# Patient Record
Sex: Female | Born: 1980 | Race: White | Hispanic: No | Marital: Single | Smoking: Never smoker
Health system: Southern US, Community
[De-identification: ages and names within clinical notes are randomized; demographics above are authoritative.]

## PROBLEM LIST (undated history)

## (undated) DIAGNOSIS — F419 Anxiety disorder, unspecified: Secondary | ICD-10-CM

## (undated) DIAGNOSIS — N289 Disorder of kidney and ureter, unspecified: Secondary | ICD-10-CM

---
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
## 2001-10-30 ENCOUNTER — Emergency Department (HOSPITAL_COMMUNITY): Admission: EM | Admit: 2001-10-30 | Discharge: 2001-10-30 | Payer: Self-pay | Admitting: Emergency Medicine

## 2002-06-26 ENCOUNTER — Emergency Department (HOSPITAL_COMMUNITY): Admission: EM | Admit: 2002-06-26 | Discharge: 2002-06-26 | Payer: Self-pay | Admitting: Emergency Medicine

## 2008-06-28 ENCOUNTER — Emergency Department (HOSPITAL_COMMUNITY): Admission: EM | Admit: 2008-06-28 | Discharge: 2008-06-28 | Payer: Self-pay | Admitting: Emergency Medicine

## 2008-07-09 ENCOUNTER — Ambulatory Visit (HOSPITAL_BASED_OUTPATIENT_CLINIC_OR_DEPARTMENT_OTHER): Admission: RE | Admit: 2008-07-09 | Discharge: 2008-07-09 | Payer: Self-pay | Admitting: Urology

## 2010-05-01 LAB — URINALYSIS, ROUTINE W REFLEX MICROSCOPIC
Nitrite: POSITIVE — AB
Protein, ur: 100 mg/dL — AB
Urobilinogen, UA: 1 mg/dL (ref 0.0–1.0)

## 2010-05-01 LAB — DIFFERENTIAL
Basophils Absolute: 0 10*3/uL (ref 0.0–0.1)
Eosinophils Absolute: 0 10*3/uL (ref 0.0–0.7)
Eosinophils Relative: 0 % (ref 0–5)
Lymphs Abs: 0.9 10*3/uL (ref 0.7–4.0)

## 2010-05-01 LAB — HEPATIC FUNCTION PANEL
ALT: 16 U/L (ref 0–35)
Bilirubin, Direct: 0.2 mg/dL (ref 0.0–0.3)
Indirect Bilirubin: 0.5 mg/dL (ref 0.3–0.9)
Total Bilirubin: 0.7 mg/dL (ref 0.3–1.2)

## 2010-05-01 LAB — BASIC METABOLIC PANEL
BUN: 5 mg/dL — ABNORMAL LOW (ref 6–23)
Chloride: 110 mEq/L (ref 96–112)
Glucose, Bld: 109 mg/dL — ABNORMAL HIGH (ref 70–99)
Potassium: 3.8 mEq/L (ref 3.5–5.1)

## 2010-05-01 LAB — LIPASE, BLOOD: Lipase: 29 U/L (ref 11–59)

## 2010-05-01 LAB — CBC
HCT: 44.3 % (ref 36.0–46.0)
MCV: 95.3 fL (ref 78.0–100.0)
Platelets: 254 10*3/uL (ref 150–400)
RDW: 13.7 % (ref 11.5–15.5)

## 2010-05-01 LAB — URINE MICROSCOPIC-ADD ON

## 2010-06-06 NOTE — Op Note (Signed)
NAME:  Nicole Randolph, Nicole Randolph            ACCOUNT NO.:  [id number]   MEDICAL RECORD NO.:  [id number]          PATIENT TYPE:  AMB   LOCATION:  [facility]                         FACILITY:  [facility]   PHYSICIAN:  Excell Seltzer. Annabell Howells, M.D.    DATE OF BIRTH:  1980-03-06   DATE OF PROCEDURE:  07/09/2008  DATE OF DISCHARGE:                               OPERATIVE REPORT   PROCEDURE:  Cystoscopy with left ureteroscopic stone extraction and  placement of left double-J stent.   PREOPERATIVE DIAGNOSIS:  Left distal ureteral stone.   POSTOPERATIVE DIAGNOSES:  1. Left distal ureteral stone.  2. Ureteral stricture.   SURGEON:  Dr. Bjorn Pippin   ANESTHESIA:  General.   SPECIMEN:  Stone.   DRAIN:  A 6-French 24 cm double-J stent.   COMPLICATIONS:  None.   INDICATIONS:  [name] is a 29 year old white female originally referred  from the emergency room for a left distal ureteral stone.  She has  failed conservative management and is to undergo ureteroscopic stone  extraction.   FINDINGS AND PROCEDURE:  The patient was given Cipro.  She was taken to  the operating room and general anesthetic was induced.  She was placed  in lithotomy position.  Her perineum and genitalia were prepped with  Betadine solution.  She was draped in the usual sterile fashion.  The 6-  French short ureteroscope was passed.  The bladder and urethra were  inspected and were normal.  However, the left ureteral orifice was noted  to be small.  A guidewire was passed through the cystoscope to the  kidney by the stone and a 12-French introducer sheath inner core dilator  was passed to the level of the stone, but there was resistance and it  would not pass by the stone.   The ureteroscope was then inserted alongside the wire, but I was unable  to get to the stone because of residual stricturing.   A balloon dilation catheter was then passed over the wire crossing the  stricture.  The balloon was dilated to 20 atmospheres with  disappearance  of the waste using a 15-French balloon.   The ureteroscope was then reinserted alongside the wire.  There was some  mucosal tearing from the balloon, but the stone was visualized.  It was  grasped with a nitinol basket and removed without difficulty.   The cystoscope was then inserted over the wire and a 6-French 24 cm  double-J stent was inserted over the wire to the kidney under  fluoroscopic guidance.  The wire was removed leaving a good coil in the  kidney and a good coil in  the bladder.  The bladder was drained.  A B and O suppository was  placed.  The patient was taken down from lithotomy position.  Her  anesthetic was reversed.  She was moved to the recovery room in stable  condition.  The stone was given to her family.  She will return in a  week for removal of her stent.      Excell Seltzer. Annabell Howells, M.D.  Electronically Signed     JJW/MEDQ  D:  07/09/2008  T:  07/09/2008  Job:  [id number]

## 2011-04-13 ENCOUNTER — Emergency Department (HOSPITAL_COMMUNITY): Payer: Medicaid Other

## 2011-04-13 ENCOUNTER — Encounter (HOSPITAL_COMMUNITY): Payer: Self-pay | Admitting: Emergency Medicine

## 2011-04-13 ENCOUNTER — Emergency Department (HOSPITAL_COMMUNITY)
Admission: EM | Admit: 2011-04-13 | Discharge: 2011-04-13 | Disposition: A | Discharge status: Home / Self Care | Payer: Medicaid Other | Attending: Emergency Medicine | Admitting: Emergency Medicine

## 2011-04-13 DIAGNOSIS — R05 Cough: Secondary | ICD-10-CM | POA: Insufficient documentation

## 2011-04-13 DIAGNOSIS — J069 Acute upper respiratory infection, unspecified: Secondary | ICD-10-CM | POA: Insufficient documentation

## 2011-04-13 DIAGNOSIS — R059 Cough, unspecified: Secondary | ICD-10-CM | POA: Insufficient documentation

## 2011-04-13 MED ORDER — HYDROCODONE-HOMATROPINE 5-1.5 MG/5ML PO SYRP: 5.0000 mL | ORAL_SOLUTION | Freq: Four times a day (QID) | ORAL | Status: AC | PRN

## 2011-04-13 MED ORDER — MOMETASONE FUROATE 50 MCG/ACT NA SUSP: 2.0000 | Freq: Every day | NASAL | Status: AC

## 2011-04-13 NOTE — ED Provider Notes (Signed)
History     CSN: [id number]  Arrival date & time 04/13/11  2100   First MD Initiated Contact with Patient 04/13/11 2219      Chief Complaint  Patient presents with  . Cough    (Consider location/radiation/quality/duration/timing/severity/associated sxs/prior treatment) HPI Comments: Patient with no significant past medical history presents emergency department with chief complaint of cough.  Onset of symptoms is 4 days ago.  Cough is nonproductive in nature.  Associated symptoms include mild headache, nasal congestion, sneezing, and rhinorrhea.  Progression of symptoms has been gradual but persistent.  Patient denies fevers, night sweats, chills, shortness of breath, wheezing, change in vision, severe headaches, chest pain.  Patient is a 31 y.o. female presenting with cough. The history is provided by the patient.  Cough Associated symptoms include rhinorrhea. Pertinent negatives include no chest pain, no chills, no ear pain, no headaches, no sore throat, no myalgias, no shortness of breath and no wheezing.    History reviewed. No pertinent past medical history.  History reviewed. No pertinent past surgical history.  No family history on file.  History  Substance Use Topics  . Smoking status: Never Smoker   . Smokeless tobacco: Not on file  . Alcohol Use: Yes    OB History    Grav Para Term Preterm Abortions TAB SAB Ect Mult Living                  Review of Systems  Constitutional: Negative for fever, chills, appetite change and fatigue.  HENT: Positive for congestion and rhinorrhea. Negative for hearing loss, ear pain, nosebleeds, sore throat, sneezing, trouble swallowing, neck stiffness, voice change, postnasal drip, sinus pressure, tinnitus and ear discharge.   Eyes: Negative for photophobia and visual disturbance.  Respiratory: Positive for cough. Negative for apnea, choking, chest tightness, shortness of breath, wheezing and stridor.   Cardiovascular: Negative for  chest pain, palpitations and leg swelling.  Gastrointestinal: Negative for nausea, vomiting, abdominal pain, diarrhea and constipation.  Genitourinary: Positive for urgency. Negative for dysuria and flank pain.  Musculoskeletal: Negative for myalgias.  Neurological: Negative for dizziness, seizures, syncope, weakness, light-headedness, numbness and headaches.  Psychiatric/Behavioral: Negative for behavioral problems and confusion.  All other systems reviewed and are negative.    Allergies  Review of patient's allergies indicates no known allergies.  Home Medications   Current Outpatient Rx  Name Route Sig Dispense Refill  . ALPRAZOLAM 0.5 MG PO TABS Oral Take 0.5 mg by mouth 3 (three) times daily as needed. For anxiety      BP 114/80  Temp(Src) 98.3 F (36.8 C) (Oral)  Resp 18  SpO2 99%  LMP 03/29/2011  Physical Exam  Constitutional: She is oriented to person, place, and time. She appears well-developed and well-nourished. No distress.  HENT:  Head: Normocephalic and atraumatic.  Mouth/Throat: Oropharynx is clear and moist. No oropharyngeal exudate.       No tenderness to palpation of frontal or maxillary sinuses  Eyes: Pupils are equal, round, and reactive to light. No scleral icterus.       Normal at [facility], [facility]  Neck: Normal range of motion. Neck supple. No tracheal deviation present. No thyromegaly present.  Cardiovascular: Normal rate, regular rhythm, normal heart sounds and intact distal pulses.   Pulmonary/Chest: Effort normal and breath sounds normal. No stridor. No respiratory distress. She has no wheezes.       Lungs clear to auscultation bilaterally.  Abdominal: Soft.  Musculoskeletal: Normal range of motion. She exhibits no edema  and no tenderness.  Neurological: She is alert and oriented to person, place, and time. Coordination normal.  Skin: Skin is warm and dry. No rash noted. She is not diaphoretic. No erythema. No pallor.  Psychiatric: She has a normal  mood and affect. Her behavior is normal.    ED Course  Procedures (including critical care time)  Labs Reviewed - No data to display Dg Chest 2 View  04/13/2011  *RADIOLOGY REPORT*  Clinical Data: Cough, congestion, difficulty breathing, smoker  CHEST - 2 VIEW  Comparison: None  Findings: Normal heart size, mediastinal contours, and pulmonary vascularity. Minimal peribronchial thickening. No pulmonary infiltrate, pleural effusion, or pneumothorax. Bones unremarkable.  IMPRESSION: Minimal bronchitic changes without acute infiltrate.  Original Report Authenticated By: Lollie Marrow, M.D.     No diagnosis found.    MDM  URI  Pt CXR negative for acute infiltrate. Patients symptoms are consistent with URI, likely viral etiology. Discussed that antibiotics are not indicated for viral infections. Pt will be discharged with symptomatic treatment.  Verbalizes understanding and is agreeable with plan. Pt is hemodynamically stable & in NAD prior to dc.         Jaci Carrel, [place] 04/13/11 2230

## 2011-04-13 NOTE — ED Notes (Signed)
PT. REPORTS PERSISTENT PRODUCTIVE COUGH WITH NASAL CONGESTION AND SNEEZING FOR SEVERAL DAYS , DENIES FEVER OR CHILLS, SLIGHT HEADACHE.

## 2011-04-13 NOTE — ED Notes (Signed)
Pt reports having coughing and nasal congestion "for several weeks". Pt denies any pain, sore throat, n/v/d, fever or chills.

## 2011-04-13 NOTE — Discharge Instructions (Signed)

## 2011-04-14 NOTE — ED Provider Notes (Signed)
Medical screening examination/treatment/procedure(s) were performed by non-physician practitioner and as supervising physician I was immediately available for consultation/collaboration.   Vir Whetstine W. Burnett Lieber, MD 04/14/11 1156 

## 2012-09-21 IMAGING — CR DG CHEST 2V
2 series · 2 of 2 positions shown · non-contrast
Comparison: None

CLINICAL DATA: Cough, congestion, difficulty breathing, smoker

CHEST - 2 VIEW

[w chest pa]
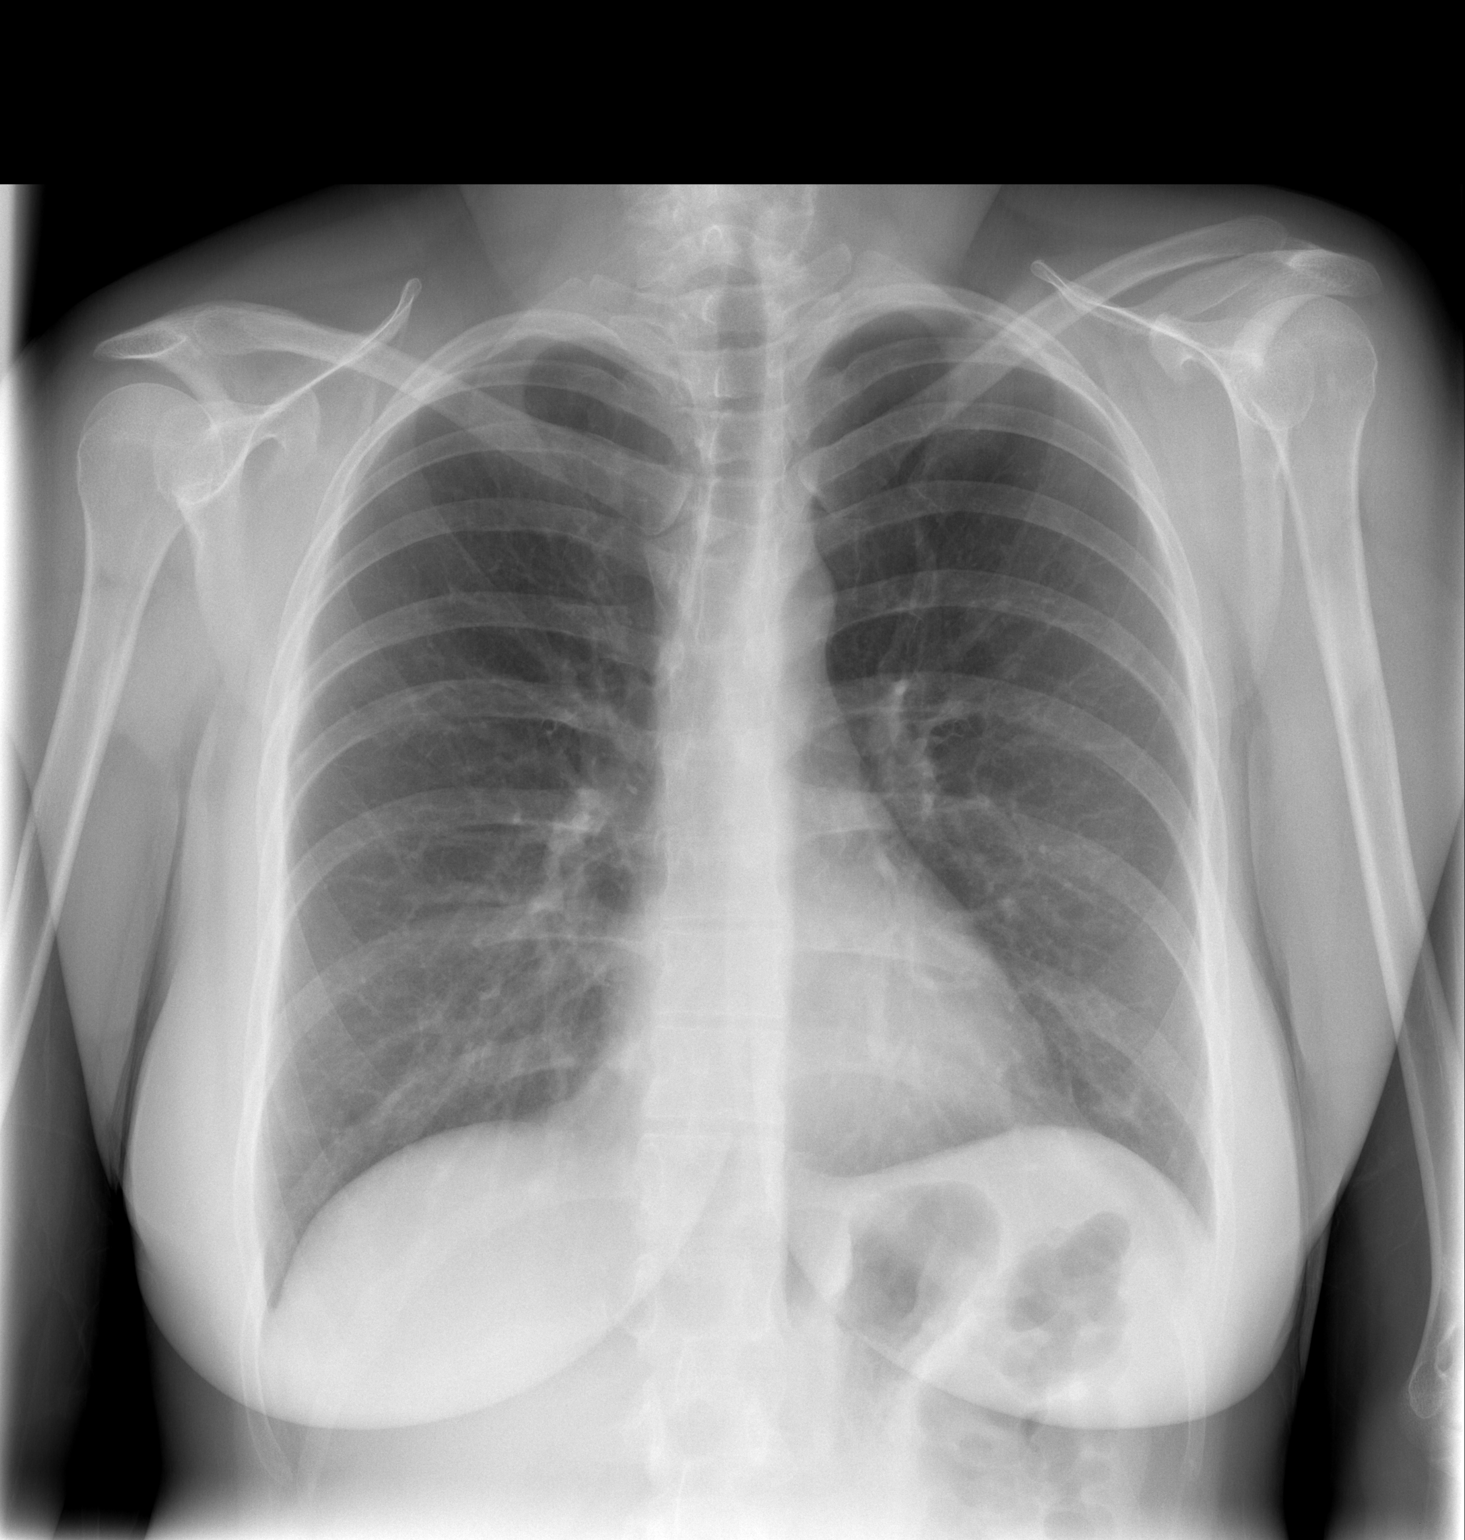

[w chest lat]
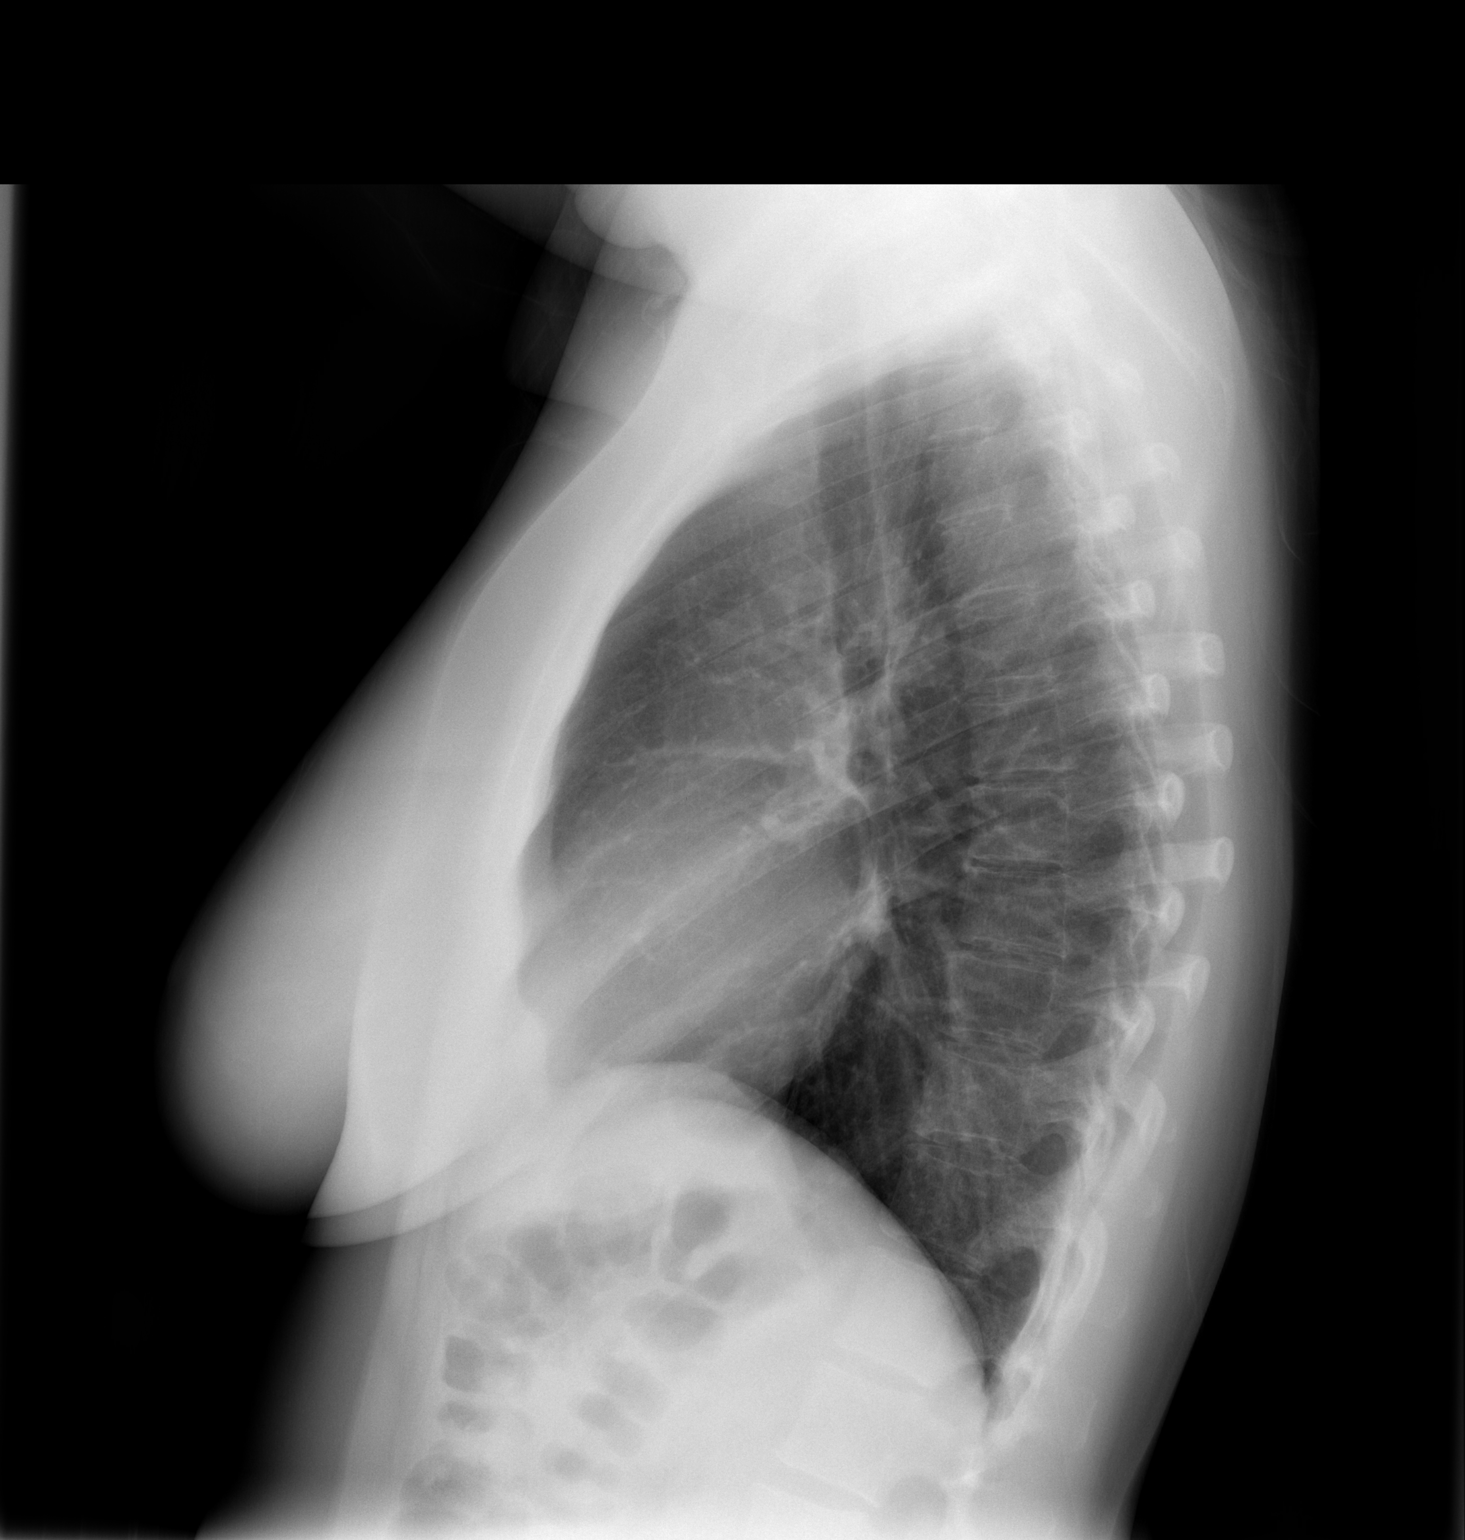

[2 of 2 positions shown; findings below may reference images not displayed]

FINDINGS: Normal heart size, mediastinal contours, and pulmonary vascularity.
Minimal peribronchial thickening.
No pulmonary infiltrate, pleural effusion, or pneumothorax.
Bones unremarkable.
IMPRESSION: Minimal bronchitic changes without acute infiltrate.

## 2014-12-02 ENCOUNTER — Emergency Department (HOSPITAL_COMMUNITY)
Admission: EM | Admit: 2014-12-02 | Discharge: 2014-12-02 | Disposition: A | Discharge status: Home / Self Care | Payer: Medicaid Other | Attending: Emergency Medicine | Admitting: Emergency Medicine

## 2014-12-02 ENCOUNTER — Encounter (HOSPITAL_COMMUNITY): Payer: Self-pay | Admitting: Emergency Medicine

## 2014-12-02 DIAGNOSIS — K002 Abnormalities of size and form of teeth: Secondary | ICD-10-CM | POA: Diagnosis not present

## 2014-12-02 DIAGNOSIS — Z8659 Personal history of other mental and behavioral disorders: Secondary | ICD-10-CM | POA: Diagnosis not present

## 2014-12-02 DIAGNOSIS — Z87448 Personal history of other diseases of urinary system: Secondary | ICD-10-CM | POA: Diagnosis not present

## 2014-12-02 DIAGNOSIS — K130 Diseases of lips: Secondary | ICD-10-CM | POA: Diagnosis present

## 2014-12-02 HISTORY — DX: Disorder of kidney and ureter, unspecified: N28.9

## 2014-12-02 HISTORY — DX: Anxiety disorder, unspecified: F41.9

## 2014-12-02 MED ORDER — AMOXICILLIN-POT CLAVULANATE 500-125 MG PO TABS: 1.0000 | ORAL_TABLET | Freq: Three times a day (TID) | ORAL | Status: AC

## 2014-12-02 MED ORDER — LIDOCAINE-EPINEPHRINE 2 %-1:100000 IJ SOLN
20.0000 mL | Freq: Once | INTRAMUSCULAR | Status: AC
  Administered 2014-12-02: 20 mL
  Filled 2014-12-02: qty 1

## 2014-12-02 MED ORDER — KETOROLAC TROMETHAMINE 60 MG/2ML IM SOLN
60.0000 mg | Freq: Once | INTRAMUSCULAR | Status: AC
  Administered 2014-12-02: 60 mg via INTRAMUSCULAR
  Filled 2014-12-02: qty 2

## 2014-12-02 NOTE — ED Notes (Signed)
Per pt, states abscess on left lower lip for 2 weeks

## 2014-12-02 NOTE — ED Provider Notes (Signed)
CSN: [id number]     Arrival date & time 12/02/14  1126 History   First MD Initiated Contact with Patient 12/02/14 1234     Chief Complaint  Patient presents with  . Abscess    HPI   Ms. Nicole Randolph is an 34 y.o. female with history of anxiety who presents to the ED for evaluation of an abscess on her lip. She states that she first noticed a small bump in the left corner of her lower lip about two weeks ago. Since then she states it has grown in size and become more painful. She states that a couple times it has drained purulent discharge on its own and then gotten bigger again. She denies injury or trauma to her lip. States nothing like this has ever happened before. Denies fever, chills, numbness/tingling, CP, SOB. Admits to smoking 1/2 PPD but denies EtOH or other illicit drug use. She states she does not follow regularly with a dentist or PCP.   Past Medical History  Diagnosis Date  . Anxiety   . Renal disorder    History reviewed. No pertinent past surgical history. No family history on file. Social History  Substance Use Topics  . Smoking status: Never Smoker   . Smokeless tobacco: None  . Alcohol Use: No   OB History    No data available     Review of Systems  All other systems reviewed and are negative.     Allergies  Review of patient's allergies indicates no known allergies.  Home Medications   Prior to Admission medications   Medication Sig Start Date End Date Taking? Authorizing Provider  amoxicillin-clavulanate (AUGMENTIN) 500-125 MG tablet Take 1 tablet (500 mg total) by mouth every 8 (eight) hours. 12/02/14   Ace GinsSerena Y Stephfon Bovey, PA-C   BP 130/80 mmHg  Pulse 93  Temp(Src) 97.8 F (36.6 C) (Oral)  Resp 18  SpO2 98% Physical Exam  Constitutional: No distress.  HENT:  Right Ear: External ear normal.  Left Ear: External ear normal.  Mouth/Throat: Oropharynx is clear and moist. No oropharyngeal exudate.     Poor dentition but otherwise no dental abscess  visualized. No trismus. No adenopathy.   Eyes: Conjunctivae and EOM are normal.  Neck: Normal range of motion. No tracheal deviation present.  Cardiovascular: Normal rate, regular rhythm and normal heart sounds.   Pulmonary/Chest: Effort normal and breath sounds normal.  Abdominal: Soft. Bowel sounds are normal. There is no tenderness.  Musculoskeletal: Normal range of motion.  Lymphadenopathy:    She has no cervical adenopathy.  Skin: Skin is warm and dry.    ED Course  Procedures (including critical care time)  INCISION AND DRAINAGE Performed by: Noelle PennerSerena Nikisha Fleece Consent: Verbal consent obtained. Risks and benefits: risks, benefits and alternatives were discussed Type: abscess  Body area: lower lip, inner L corner  Anesthesia: local infiltration  Incision was made with a scalpel.  Local anesthetic: lidocaine 2% with epinephrine  Anesthetic total: 1 ml  Complexity: complex Blunt dissection to break up loculations  Drainage: purulent  Drainage amount: 1 CC  Packing material: none  Patient tolerance: Patient tolerated the procedure well with no immediate complications.    Labs Review Labs Reviewed - No data to display  Imaging Review No results found. I have personally reviewed and evaluated these images and lab results as part of my medical decision-making.   EKG Interpretation None      MDM   Final diagnoses:  Lip abscess    Pt presents  with abscess on lower lip. Unclear inciting factor/incident as pt denies injury, trauma, or lesion to the area prior to symptom onset. Discussed I&D here but pt will need to follow-up with dentist or PCP within one week.   Pt tolerated procedure well. Purulent discharge expressed, irrigated area with saline. No packing or closure of area.. Pt comfortable and not in any pain on discharge. Will give Rx for augmentin given that lesion is partially intra-oral. Discussed that pt will need f/u within one week. Return precautions  given.     Carlene Coria, PA-C 12/02/14 1505  Richardean Canal, MD 12/03/14 [phone]

## 2014-12-02 NOTE — Discharge Instructions (Signed)
Please follow-up with a dentist  Within one week as discussed. See below for dental resources in this area.   Emergency Department Resource Guide 1) Find a Doctor and Pay Out of Pocket Although you won't have to find out who is covered by your insurance plan, it is a good idea to ask around and get recommendations. You will then need to call the office and see if the doctor you have chosen will accept you as a new patient and what types of options they offer for patients who are self-pay. Some doctors offer discounts or will set up payment plans for their patients who do not have insurance, but you will need to ask so you aren't surprised when you get to your appointment.  2) Contact Your Local Health Department Not all health departments have doctors that can see patients for sick visits, but many do, so it is worth a call to see if yours does. If you don't know where your local health department is, you can check in your phone book. The CDC also has a tool to help you locate your state's health department, and many state websites also have listings of all of their local health departments.  3) Find a Walk-in Clinic If your illness is not likely to be very severe or complicated, you may want to try a walk in clinic. These are popping up all over the country in pharmacies, drugstores, and shopping centers. They're usually staffed by nurse practitioners or physician assistants that have been trained to treat common illnesses and complaints. They're usually fairly quick and inexpensive. However, if you have serious medical issues or chronic medical problems, these are probably not your best option.  No Primary Care Doctor: - Call Health Connect at  [phone] - they can help you locate a primary care doctor that  accepts your insurance, provides certain services, etc. - Physician Referral Service- [phone]  Chronic Pain Problems: Organization         Address  Phone   Notes  [facility]  [phone] Patients need to be referred by their primary care doctor.   Medication Assistance: Organization         Address  Phone   Notes  [facility] Medication [facility] [address] [place] [place], [place] [address] [phone] --Must be a resident of [facility] -- Must have NO insurance coverage whatsoever (no Medicaid/ Medicare, etc.) -- The pt. MUST have a primary care doctor that directs their care regularly and follows them in the community   [facility]ist  [phone]   [employer]  [phone]    Agencies that provide inexpensive medical care: Organization         Address  Phone   Notes  [facility]  [phone]   [facility]    [phone]   [facility] [address] [place], [place] [address] [phone]   [facility] [address] [place] [place] [phone]   Planned Parenthood    [phone]   [facility]    [phone]   [facility]  [address], [place] Phone:  [phone], Fax:  [phone] Hours of Operation:  9 am - 6 pm, M-F.  Also accepts Medicaid/Medicare and self-pay.  [facility]  [address], [place] Phone: [phone], Fax: [phone].  Hours of Operation:  8:30 am - 5:30 pm, M-F.  Also accepts Medicaid and self-pay.  [facility] [place] [address], Home Phone: [phone]   [place], [place], [place] [phone], Ext. [phone] Mondays & Thursdays: 7-9 AM.  First 15 patients are seen on a first come, first serve basis.    [place] Providers:  Organization         Address  Phone   Notes  [facility] [address], [place] [phone] Also accepts self-pay patients.  [facility] [address], [employer]  [phone]   [place], [address], [place] [phone]   [facility] [address] [place] [phone]   Lucianne Lei [address], [place]   [phone] Only accepts [place] Access [place] patients after they have their name applied to their card.   Self-Pay (no insurance) in [facility]:  Organization         Address  Phone   Notes  Sickle Cell Patients, [facility] [place] [phone]   [facility] [place] [phone]   [facility] [place]  [place], [place],  [phone]   [facility]/Dr. Osei-Bonsu  [address] [place] or [address], [place] [phone] Phone number for both [place] and [place] locations is the same.  [facility] [address], [place] [phone]   [facility] [address] [place] or [address] [phone]   [facility] [address] [place] [phone], phone; [phone], fax Sees patients 1st and 3rd Saturday of every month.  Must not qualify for public or private insurance (i.e. Medicaid, Medicare, [place] Health Choice, Veterans' Benefits)  Household income should be no more than 200% of the poverty level The clinic cannot treat you if you are pregnant or think you are pregnant  Sexually transmitted diseases are not treated at the clinic.    Dental Care: Organization         Address  Phone  Notes  [facility] of [facility] [phone] Accepts children up to age 74 who are enrolled in [place] or [place]; pregnant women with a Medicaid card; and children who have applied for Medicaid or [place]  Health Choice, but were declined, whose parents can pay a reduced fee at time of service.  [facility] of [facility]  [address], [place] [phone] Accepts children up to age 16 who are enrolled in [place] or [place]; pregnant women with a Medicaid card; and children who have applied for Medicaid or [place] Health Choice, but were declined, whose parents can pay a reduced fee at time of service.  [facility]  [place] [phone] Patients are seen by appointment only. Walk-ins are not accepted. [place] will see patients 18 years of age and older. Monday - Tuesday (8am-5pm) Most Wednesdays (8:30-5pm) $30 per visit, cash only  [facility] Adult [employer] PROGRAM  [address], [facility] [phone] Patients  are seen by appointment only. Walk-ins are not accepted. [place] will see patients 46 years of age and older. One Wednesday Evening (Monthly: Volunteer Based).  $30 per visit, cash only  [place]  [phone] for adults; Children under age 68, call Graduate Pediatric Dentistry at [phone]. Children aged 19-14, please call [phone] to request a pediatric application.  Dental services are provided in all areas of dental care including fillings, crowns and bridges, complete and partial dentures, implants, gum treatment, root canals, and extractions. Preventive care is also provided. Treatment is provided to both adults and children. Patients are selected via a lottery and there is often a waiting list.   [facility] [address], [place]  [phone] [email or web address]   [facility] [address] [place], [place] [phone], Ext. [phone] Second and Fourth Thursday of each month, opens at 6:30 AM; Clinic ends at 9 AM.  Patients are seen on a first-come first-served basis, and a limited number are seen during  each clinic.   [facility]  [address] [place], [place] [phone]   Eligibility Requirements You must have lived in [place], [place], or [place] counties for at least the last three months.   You cannot be eligible for state or federal sponsored [employer], including [employer], [place], or [employer].   You generally cannot be eligible for healthcare insurance through your employer.    How to apply: Eligibility screenings are held every Tuesday and Wednesday afternoon from 1:00 pm until 4:00 pm. You do not need an appointment for the interview!  [facility] [address] [place], [place]   [place]  [place] Department  [place]  [phone]    Behavioral Health Resources in the Community: Intensive Outpatient Programs Organization         Address  Phone  Notes  [place] Hurricane. [address] [place], [place] [phone]   [facility] Outpatient [address] [place], [place]   ADS: [facility] [address] [place], [place]   [place] [address],  [place], [place] or [phone]   Substance Abuse Resources Organization         Address  Phone  Notes  [facility] Drug Services  [phone]   [place]  [phone]   [facility] [place]   Chinita Pester  [phone]   Residential & Outpatient Substance Abuse Program  [phone]   Psychological Services Organization         Address  Phone  Notes  [facility]  [place]  [phone]   [place] [address] [place] or [phone]    Mobile Crisis Teams Organization         Address  Phone  Notes  [facility], Mobile  Crisis Care Unit  [phone]   [facility]  [address] [place], [place]   [place] Levels [address], [place] [phone]    Self-Help/Support Groups Organization         Address  Phone             Notes  [place]. of [place] - variety of support groups  [place] Call  for more information  Narcotics Anonymous (NA), [facility] [address], [place]  2 meetings at this location   Residential Sports administratorTreatment Programs Organization         Address  Phone  Notes  [facility] [address],    [place]  [phone]   [facility]  [address], [place] [phone], [place], [place] [phone]   [facility] [address] [place] [address], [place] [phone] Admissions: 8am-3pm M-F  [facility] [address]    [place], [place] [phone]   [facility] [address], [place], [place] [phone]   [facility] [place] [facility] [address]  [place], [place] [phone]   [facility] - Intensive Outpatient [address], [place], [place] [phone]   [facility] ([facility] [address]  [place], [place] [phone] or [phone]   [facility] (RTS) [address] [place], [place] [phone] Accepts Medicaid  [facility] [address]  [place] [phone] Substance Abuse/Addiction Treatment   [facility] [place] Behavioral Health Resources Organization         Address  Phone  Notes  [facility]  [phone]) [phone]   Angie FavaJulie Brannon, PhD [address], [address] [place], [place]   [phone](336) [phone] or [phone]) [phone]   [facility]   [address] [place], [place] [phone]) [phone]   [facility] [address], [place], [place] [phone]) [phone] Insurance/Medicaid/sponsorship through [facility]  [facility] [address]                                    [place], [place] [phone]) [phone] Therapy/tele-psych/case  [facility] [address].   [place], [place] [phone](336) [phone]    Dr. Lolly MustacheArfeen  [phone]   [facility] [place]  [employer] [facility] 1) [address], [place] 2) [address], [place] 3)  [address], [place] [phone]) [phone]) [phone]  [phone](336) [phone]   [facility] Child Abuse Hotline 9863333342(336) [phone])[phone] or [phone](336) [phone] (After Hours)       Please obtain all of your results from medical records or have your doctors office obtain the results - share them with your doctor - you should be seen at your doctors office in the next 2 days. Call today to arrange your follow up. Take the medications as prescribed. Please review all of the medicines and only take them if you do not have an allergy to them. Please be aware that if you are taking birth control pills, taking other prescriptions, ESPECIALLY ANTIBIOTICS may make the birth control ineffective - if this is the case, either do not engage in sexual activity or use alternative methods of birth control such as condoms until you have finished the medicine and your family doctor says it is OK to restart them. If you are on a blood thinner such as COUMADIN, be aware that any other medicine that you take may cause the coumadin to either work too much, or not enough - you should have your coumadin level rechecked in next 7 days if this is the case.  ?  It is also a possibility that you have an allergic reaction to any of the medicines that you have been prescribed - Everybody reacts differently to medications and  while MOST people have no trouble with most medicines, you may have a reaction such as nausea, vomiting, rash, swelling, shortness of breath. If this is the case, please stop taking the medicine immediately and contact your physician.  ?  You should return to the ER if you develop severe or worsening  symptoms.

## 2014-12-03 ENCOUNTER — Encounter (HOSPITAL_COMMUNITY): Payer: Self-pay | Admitting: Emergency Medicine
